# Patient Record
Sex: Male | Born: 1985 | Race: White | Hispanic: No | Marital: Married | Smoking: Former smoker
Health system: Southern US, Community
[De-identification: ages and names within clinical notes are randomized; demographics above are authoritative.]

## PROBLEM LIST (undated history)

## (undated) DIAGNOSIS — F909 Attention-deficit hyperactivity disorder, unspecified type: Secondary | ICD-10-CM

## (undated) DIAGNOSIS — K589 Irritable bowel syndrome without diarrhea: Secondary | ICD-10-CM

---
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
## 2010-06-24 ENCOUNTER — Emergency Department (HOSPITAL_COMMUNITY): Admission: EM | Admit: 2010-06-24 | Discharge: 2010-06-24 | Payer: Self-pay | Admitting: Emergency Medicine

## 2016-06-07 ENCOUNTER — Emergency Department (HOSPITAL_COMMUNITY)
Admission: EM | Admit: 2016-06-07 | Discharge: 2016-06-07 | Disposition: A | Discharge status: Home / Self Care | Payer: BLUE CROSS/BLUE SHIELD | Attending: Emergency Medicine | Admitting: Emergency Medicine

## 2016-06-07 ENCOUNTER — Emergency Department (HOSPITAL_COMMUNITY): Payer: BLUE CROSS/BLUE SHIELD

## 2016-06-07 ENCOUNTER — Encounter (HOSPITAL_COMMUNITY): Payer: Self-pay

## 2016-06-07 DIAGNOSIS — F909 Attention-deficit hyperactivity disorder, unspecified type: Secondary | ICD-10-CM | POA: Diagnosis not present

## 2016-06-07 DIAGNOSIS — Y939 Activity, unspecified: Secondary | ICD-10-CM | POA: Diagnosis not present

## 2016-06-07 DIAGNOSIS — Y92410 Unspecified street and highway as the place of occurrence of the external cause: Secondary | ICD-10-CM | POA: Diagnosis not present

## 2016-06-07 DIAGNOSIS — Z23 Encounter for immunization: Secondary | ICD-10-CM | POA: Insufficient documentation

## 2016-06-07 DIAGNOSIS — R55 Syncope and collapse: Secondary | ICD-10-CM | POA: Insufficient documentation

## 2016-06-07 DIAGNOSIS — Y999 Unspecified external cause status: Secondary | ICD-10-CM | POA: Insufficient documentation

## 2016-06-07 DIAGNOSIS — S060X1A Concussion with loss of consciousness of 30 minutes or less, initial encounter: Secondary | ICD-10-CM | POA: Insufficient documentation

## 2016-06-07 DIAGNOSIS — S0181XA Laceration without foreign body of other part of head, initial encounter: Secondary | ICD-10-CM | POA: Insufficient documentation

## 2016-06-07 DIAGNOSIS — T1490XA Injury, unspecified, initial encounter: Secondary | ICD-10-CM

## 2016-06-07 DIAGNOSIS — S0990XA Unspecified injury of head, initial encounter: Secondary | ICD-10-CM | POA: Diagnosis present

## 2016-06-07 HISTORY — DX: Irritable bowel syndrome without diarrhea: K58.9

## 2016-06-07 HISTORY — DX: Attention-deficit hyperactivity disorder, unspecified type: F90.9

## 2016-06-07 LAB — COMPREHENSIVE METABOLIC PANEL
ALT: 28 U/L (ref 17–63)
ANION GAP: 6 (ref 5–15)
AST: 24 U/L (ref 15–41)
Albumin: 4.2 g/dL (ref 3.5–5.0)
Alkaline Phosphatase: 55 U/L (ref 38–126)
BUN: 12 mg/dL (ref 6–20)
CALCIUM: 9.3 mg/dL (ref 8.9–10.3)
CHLORIDE: 108 mmol/L (ref 101–111)
CO2: 23 mmol/L (ref 22–32)
CREATININE: 1 mg/dL (ref 0.61–1.24)
Glucose, Bld: 106 mg/dL — ABNORMAL HIGH (ref 65–99)
POTASSIUM: 4.2 mmol/L (ref 3.5–5.1)
SODIUM: 137 mmol/L (ref 135–145)
TOTAL PROTEIN: 6.7 g/dL (ref 6.5–8.1)
Total Bilirubin: 0.6 mg/dL (ref 0.3–1.2)

## 2016-06-07 LAB — PROTIME-INR
INR: 0.91
Prothrombin Time: 12.3 seconds (ref 11.4–15.2)

## 2016-06-07 LAB — I-STAT CHEM 8, ED
BUN: 14 mg/dL (ref 6–20)
CREATININE: 1 mg/dL (ref 0.61–1.24)
Calcium, Ion: 1.17 mmol/L (ref 1.15–1.40)
Chloride: 103 mmol/L (ref 101–111)
GLUCOSE: 101 mg/dL — AB (ref 65–99)
HCT: 49 % (ref 39.0–52.0)
HEMOGLOBIN: 16.7 g/dL (ref 13.0–17.0)
POTASSIUM: 4.2 mmol/L (ref 3.5–5.1)
Sodium: 142 mmol/L (ref 135–145)
TCO2: 26 mmol/L (ref 0–100)

## 2016-06-07 LAB — CBC
HCT: 48 % (ref 39.0–52.0)
Hemoglobin: 16.9 g/dL (ref 13.0–17.0)
MCH: 31.5 pg (ref 26.0–34.0)
MCHC: 35.2 g/dL (ref 30.0–36.0)
MCV: 89.6 fL (ref 78.0–100.0)
Platelets: 148 K/uL — ABNORMAL LOW (ref 150–400)
RBC: 5.36 MIL/uL (ref 4.22–5.81)
RDW: 12.7 % (ref 11.5–15.5)
WBC: 5.5 K/uL (ref 4.0–10.5)

## 2016-06-07 LAB — URINALYSIS, ROUTINE W REFLEX MICROSCOPIC
BILIRUBIN URINE: NEGATIVE
Glucose, UA: NEGATIVE mg/dL
HGB URINE DIPSTICK: NEGATIVE
Ketones, ur: NEGATIVE mg/dL
Leukocytes, UA: NEGATIVE
Nitrite: NEGATIVE
PROTEIN: NEGATIVE mg/dL
SPECIFIC GRAVITY, URINE: 1.013 (ref 1.005–1.030)
pH: 7 (ref 5.0–8.0)

## 2016-06-07 LAB — ETHANOL

## 2016-06-07 LAB — I-STAT CG4 LACTIC ACID, ED: Lactic Acid, Venous: 1.8 mmol/L (ref 0.5–1.9)

## 2016-06-07 MED ORDER — FENTANYL CITRATE (PF) 100 MCG/2ML IJ SOLN
50.0000 ug | Freq: Once | INTRAMUSCULAR | Status: DC
  Filled 2016-06-07: qty 2

## 2016-06-07 MED ORDER — IBUPROFEN 200 MG PO TABS
600.0000 mg | ORAL_TABLET | Freq: Once | ORAL | Status: AC
  Administered 2016-06-07: 600 mg via ORAL
  Filled 2016-06-07: qty 1

## 2016-06-07 MED ORDER — TETANUS-DIPHTH-ACELL PERTUSSIS 5-2.5-18.5 LF-MCG/0.5 IM SUSP
0.5000 mL | Freq: Once | INTRAMUSCULAR | Status: AC
  Administered 2016-06-07: 0.5 mL via INTRAMUSCULAR
  Filled 2016-06-07: qty 0.5

## 2016-06-07 MED ORDER — LIDOCAINE-EPINEPHRINE (PF) 2 %-1:200000 IJ SOLN
20.0000 mL | Freq: Once | INTRAMUSCULAR | Status: AC
  Administered 2016-06-07: 20 mL
  Filled 2016-06-07: qty 20

## 2016-06-07 NOTE — Discharge Instructions (Signed)
Have sutures removed in 7 days. Watch for signs of infection, including fever, increased redness/swelling or pus drainage an return without fail for worsening symptoms, including signs of infection, escalating pain, difficulty walking, confusion, intractable vomiting or any other symptoms concerning to you.  You did not have serious injury from your CT head and neck. X-rays of the left arm, chest and pelvis also were normal.  Take tylenol and ibuprofen for pain. Use ice packs or heat packs.
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
## 2016-06-07 NOTE — ED Notes (Signed)
Cleaned laceration under chin.
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
## 2016-06-07 NOTE — ED Notes (Signed)
Patient at CT
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
## 2019-07-17 ENCOUNTER — Other Ambulatory Visit: Payer: Self-pay

## 2019-07-17 DIAGNOSIS — Z20822 Contact with and (suspected) exposure to covid-19: Secondary | ICD-10-CM

## 2019-07-19 LAB — NOVEL CORONAVIRUS, NAA: SARS-CoV-2, NAA: NOT DETECTED

## 2020-04-27 ENCOUNTER — Other Ambulatory Visit: Payer: Self-pay | Admitting: Family Medicine

## 2020-04-27 DIAGNOSIS — R0981 Nasal congestion: Secondary | ICD-10-CM

## 2020-05-04 ENCOUNTER — Other Ambulatory Visit: Payer: Self-pay | Admitting: Family Medicine

## 2020-05-06 ENCOUNTER — Ambulatory Visit
Admission: RE | Admit: 2020-05-06 | Discharge: 2020-05-06 | Disposition: A | Discharge status: Home / Self Care | Payer: PRIVATE HEALTH INSURANCE | Source: Ambulatory Visit | Attending: Family Medicine | Admitting: Family Medicine

## 2020-05-06 ENCOUNTER — Other Ambulatory Visit: Payer: Self-pay

## 2020-05-06 DIAGNOSIS — R0981 Nasal congestion: Secondary | ICD-10-CM

## 2020-05-24 ENCOUNTER — Ambulatory Visit (INDEPENDENT_AMBULATORY_CARE_PROVIDER_SITE_OTHER): Payer: PRIVATE HEALTH INSURANCE | Admitting: Otolaryngology

## 2020-05-24 ENCOUNTER — Other Ambulatory Visit: Payer: Self-pay

## 2020-05-24 ENCOUNTER — Encounter (INDEPENDENT_AMBULATORY_CARE_PROVIDER_SITE_OTHER): Payer: Self-pay | Admitting: Otolaryngology

## 2020-05-24 VITALS — Temp 97.9°F

## 2020-05-24 DIAGNOSIS — J342 Deviated nasal septum: Secondary | ICD-10-CM

## 2020-05-24 DIAGNOSIS — J310 Chronic rhinitis: Secondary | ICD-10-CM

## 2020-05-24 NOTE — Progress Notes (Signed)
HPI: Damon Molina is a 34 y.o. male who presents is referred by Dr. Laurann Montana for evaluation of chronic nasal sinus congestion.  Patient has a long history of chronic nasal congestion.  He has used Flonase regularly for a number of years.  This seems to help some but does not totally clear his nasal passages.  He does snore regularly but feels like he sleeps well.  Denies symptoms of obstructive sleep apnea or anybody witnessing that he has trouble breathing at night outside of snoring.  He seems to have more trouble breathing at night than during the day.  He recently had a CT scan performed 2 weeks ago and on review of this he had minimal sinus disease.  Mild septal deviation to the right and clear nasal passages otherwise with no intranasal polyps. He has used Diplomatic Services operational officer for a number of years as well as used azelastine in the past.  Past Medical History:  Diagnosis Date  . ADHD (attention deficit hyperactivity disorder)   . IBS (irritable bowel syndrome)    No past surgical history on file. Social History   Socioeconomic History  . Marital status: Married    Spouse name: Not on file  . Number of children: Not on file  . Years of education: Not on file  . Highest education level: Not on file  Occupational History  . Not on file  Tobacco Use  . Smoking status: Former Smoker    Packs/day: 0.50    Years: 14.00    Pack years: 7.00  . Smokeless tobacco: Never Used  Substance and Sexual Activity  . Alcohol use: Not on file  . Drug use: Not on file  . Sexual activity: Not on file  Other Topics Concern  . Not on file  Social History Narrative  . Not on file   Social Determinants of Health   Financial Resource Strain:   . Difficulty of Paying Living Expenses: Not on file  Food Insecurity:   . Worried About Programme researcher, broadcasting/film/video in the Last Year: Not on file  . Ran Out of Food in the Last Year: Not on file  Transportation Needs:   . Lack of Transportation (Medical): Not  on file  . Lack of Transportation (Non-Medical): Not on file  Physical Activity:   . Days of Exercise per Week: Not on file  . Minutes of Exercise per Session: Not on file  Stress:   . Feeling of Stress : Not on file  Social Connections:   . Frequency of Communication with Friends and Family: Not on file  . Frequency of Social Gatherings with Friends and Family: Not on file  . Attends Religious Services: Not on file  . Active Member of Clubs or Organizations: Not on file  . Attends Banker Meetings: Not on file  . Marital Status: Not on file   No family history on file. Allergies  Allergen Reactions  . Codeine   . Penicillins    Prior to Admission medications   Medication Sig Start Date End Date Taking? Authorizing Provider  amphetamine-dextroamphetamine (ADDERALL XR) 25 MG 24 hr capsule Take 25 mg by mouth every morning.   Yes [provider]  dicyclomine (BENTYL) 10 MG capsule Take 10 mg by mouth daily.   Yes [provider]  PARoxetine (PAXIL) 10 MG tablet Take 10 mg by mouth daily.   Yes [provider]     Positive ROS: Otherwise negative  All other systems  have been reviewed and were otherwise negative with the exception of those mentioned in the HPI and as above.  Physical Exam: Constitutional: Alert, well-appearing, no acute distress.  Patient is moderately overweight. Ears: External ears without lesions or tenderness. Ear canals are clear bilaterally with intact, clear TMs.  Nasal: External nose without lesions. Septum is mildly deviated to the right anteriorly.  After decongesting the nose nasal passages otherwise clear with no polyps or intranasal lesions noted.  Both middle meatus regions are clear.  He has moderate size inferior turbinates..  After decongesting the nose with Afrin he breathe much more comfortably. Oral: Lips and gums without lesions. Tongue and palate mucosa without lesions. Posterior oropharynx clear.  Patient  with small average sized tonsils. Neck: No palpable adenopathy or masses Respiratory: Breathing comfortably  Skin: No facial/neck lesions or rash noted.  Procedures  Assessment: Mild septal deformity with moderate turbinate partially. No significant sinus disease on review of recent CT scan.  Plan: I discussed with him that I would recommend regular use of nasal steroid spray as he has been doing. Reviewed the CT scan with him in the office today and there is no significant evidence of sinus disease contributing to his nasal obstruction. He would be a candidate for septoplasty and turbinate reductions to help improve his nasal airway if he was interested in surgical approach.  Also reviewed with him that this may help improve the snoring although he could try use of Afrin for couple nights to see how much improvement of his nasal airway will improve his snoring but would not use Afrin regularly as this can be addictive and cause more problems and he is aware this. He will call us back if he decides to pursue surgical intervention to help improve his nasal airway.   Narda Bonds, MD   CC:

## 2021-06-23 IMAGING — CT CT MAXILLOFACIAL W/O CM
1 series · 10 of 30 positions shown, 13 images · non-contrast
Comparison: None.

CLINICAL DATA: Nasal congestion, snoring.

EXAM:
CT MAXILLOFACIAL WITHOUT CONTRAST
TECHNIQUE: Multidetector CT imaging of the maxillofacial structures was
performed. Multiplanar CT image reconstructions were also generated.

[Series 8: sag soft · sagittal · 0.26mm/px · 10 of 111 slices shown, 13 images]
[im 8/111  brain]
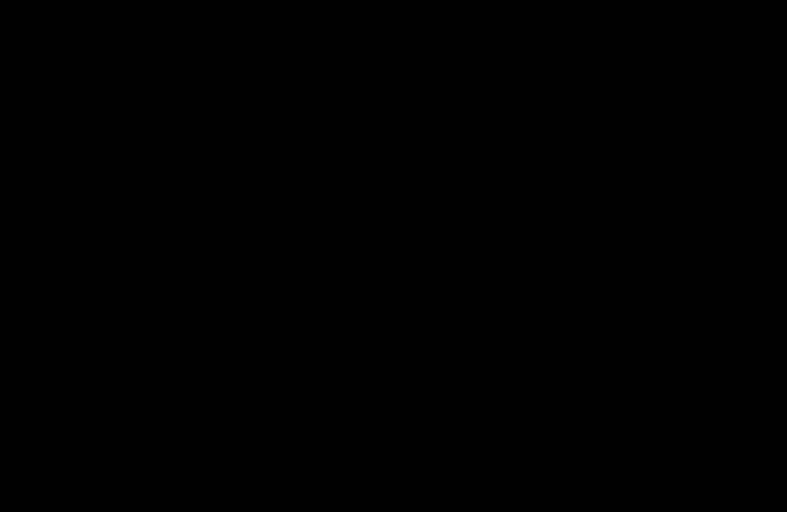
[im 8/111  bone]
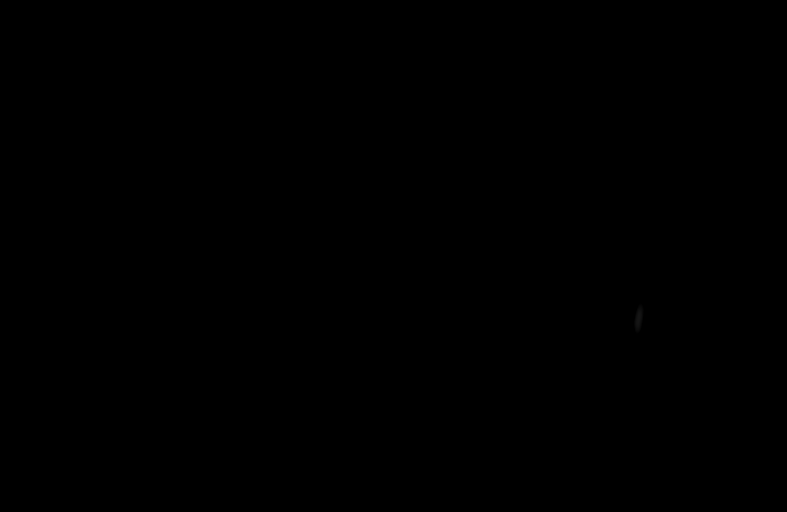
[im 19/111  bone]
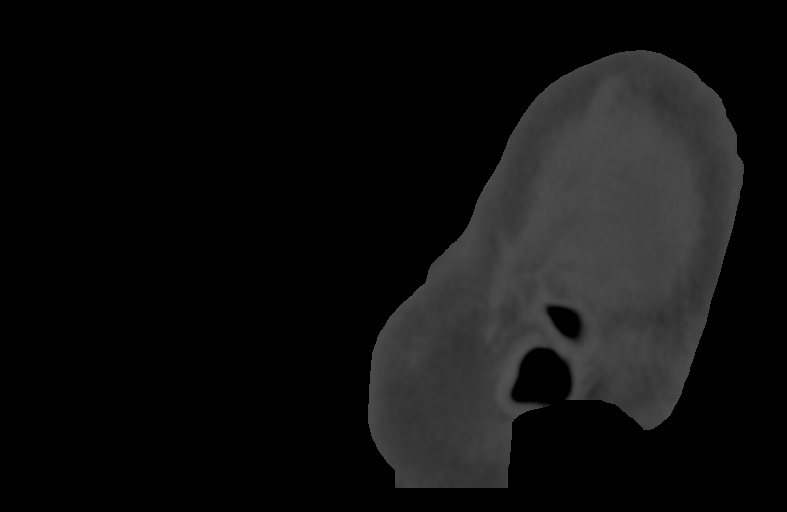
[im 31/111  bone]
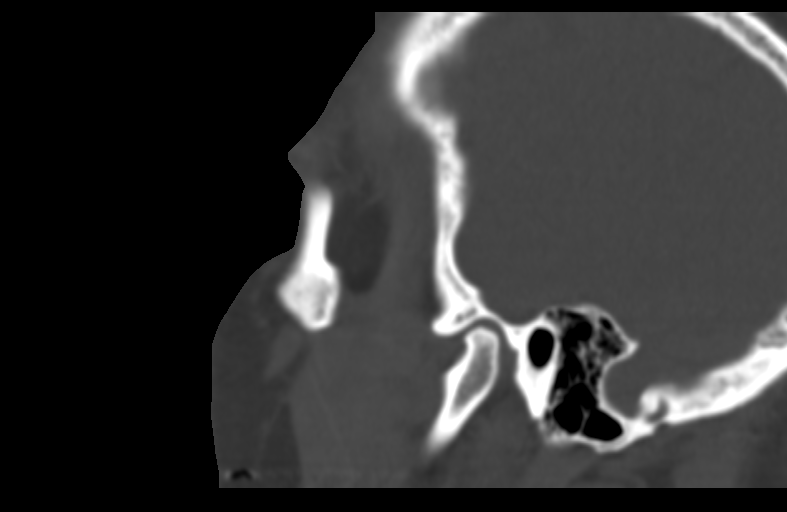
[im 38/111  bone]
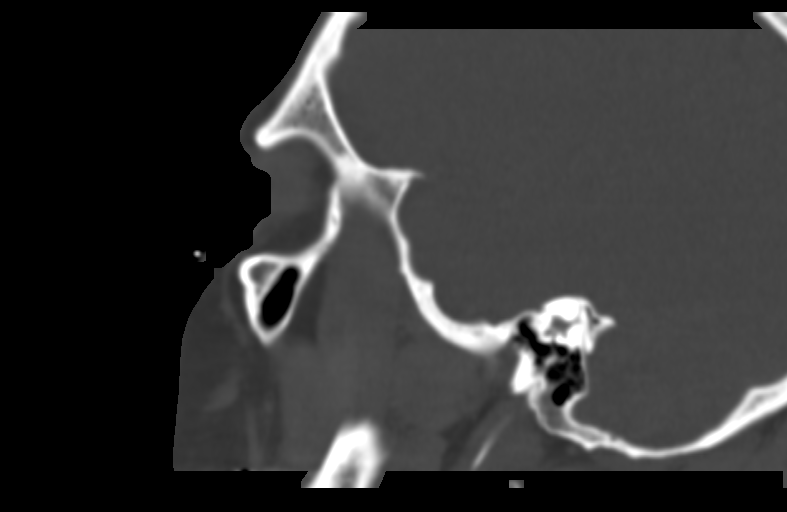
[im 50/111  brain]
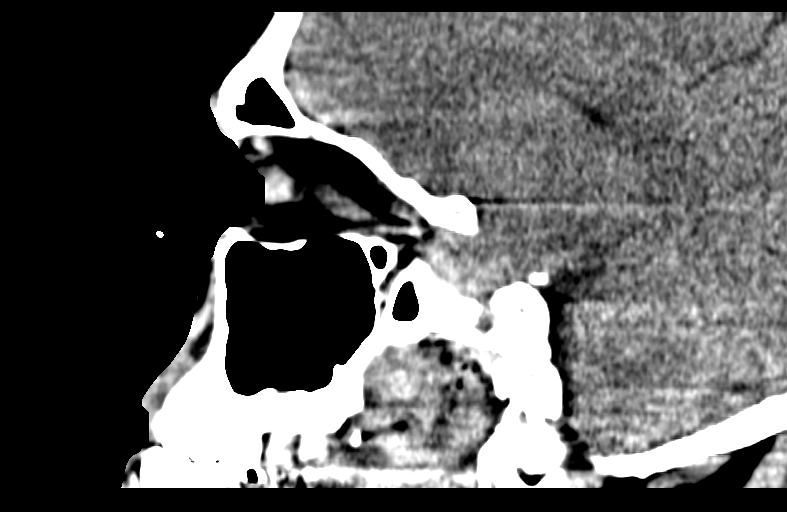
[im 50/111  bone]
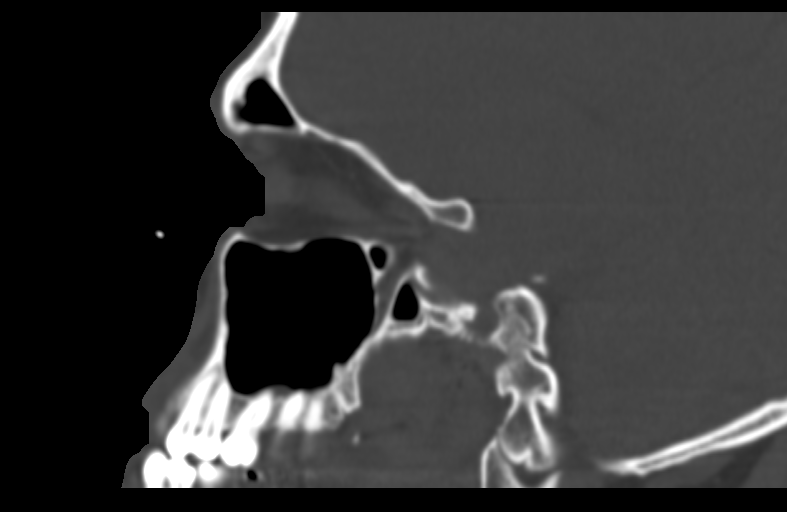
[im 61/111  bone]
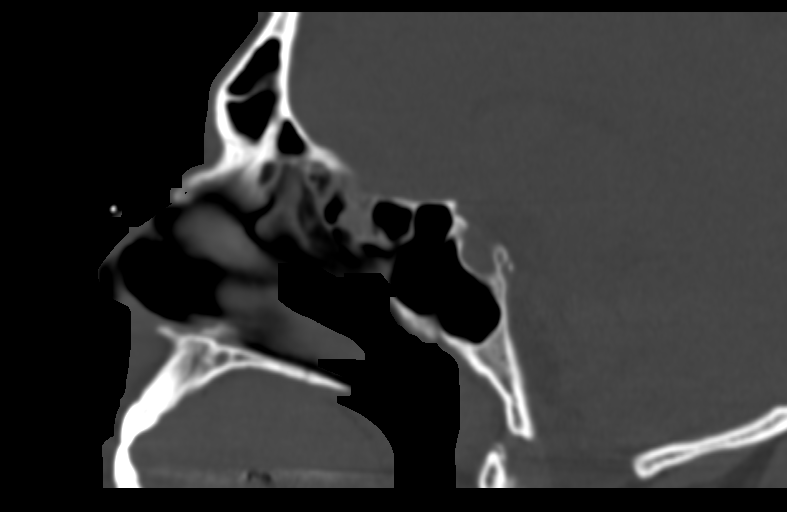
[im 73/111  bone]
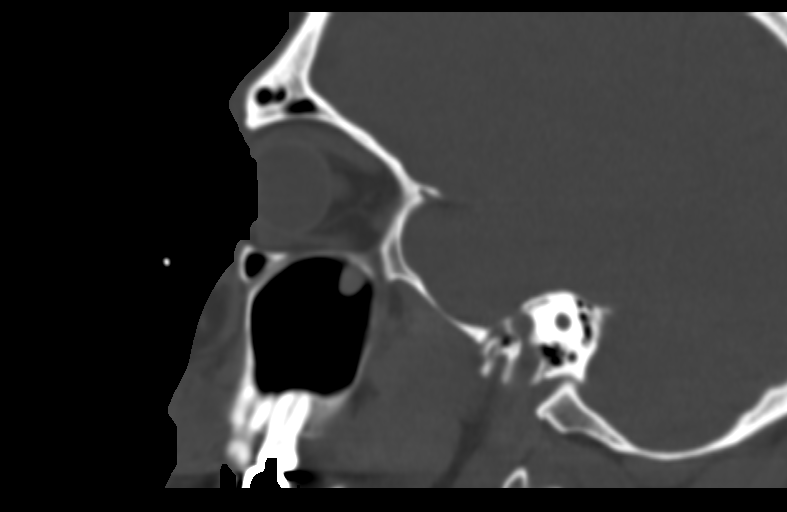
[im 84/111  bone]
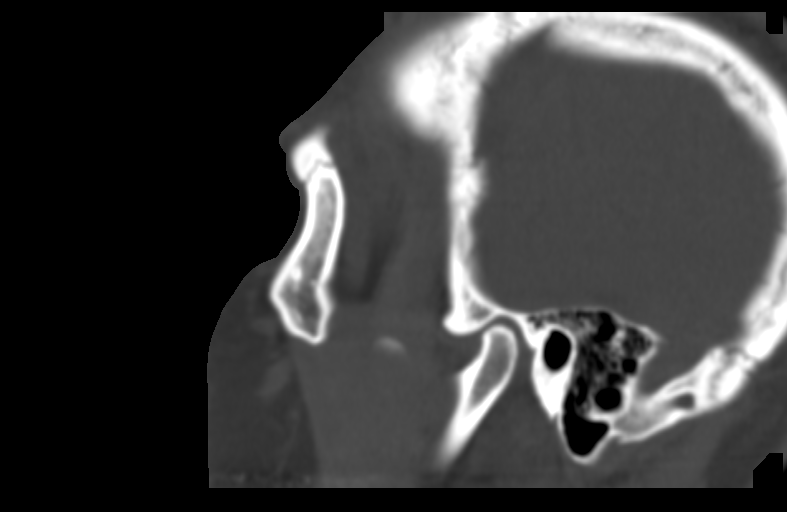
[im 92/111  brain]
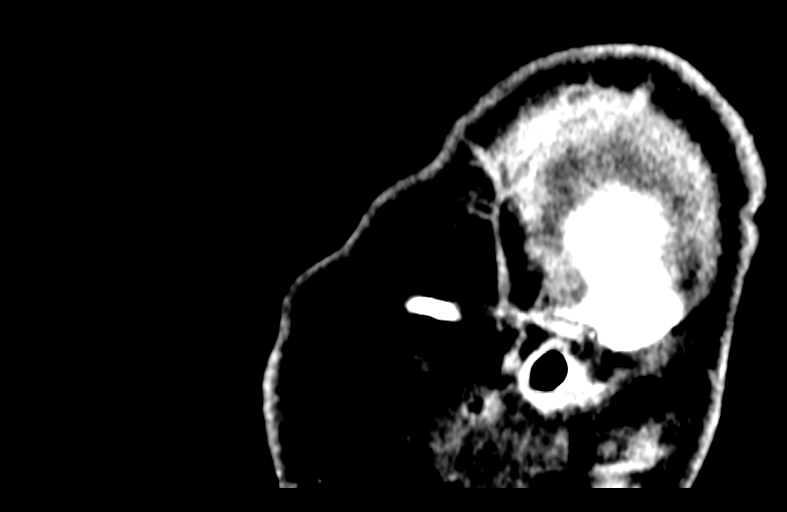
[im 92/111  bone]
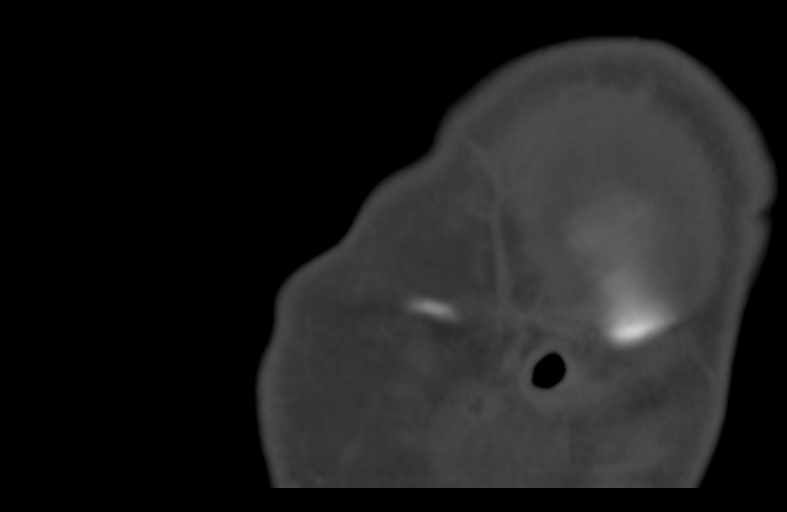
[im 103/111  bone]
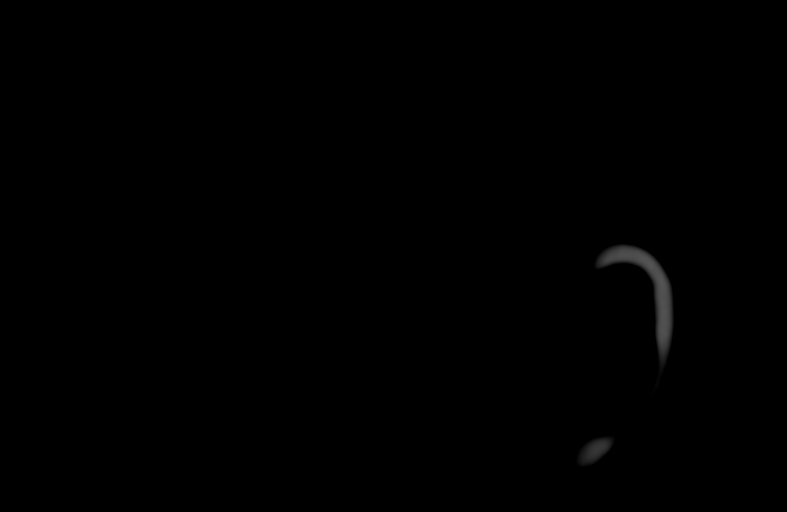

[10 of 30 positions shown; findings below may reference images not displayed]

FINDINGS: Osseous: No fracture or mandibular dislocation. No destructive
process.

Orbits: Negative. No traumatic or inflammatory finding.

Sinuses: Paranasal sinuses are well developed and well aerated. Mild
opacification over the ethmoid air cells. Minimal focal mucosal
thickening over the maxillary sinus likely small polyps or mucous
retention cysts. No air-fluid levels. No significant mucoperiosteal
thickening. Ostiomeatal complexes are patent.

Soft tissues: Negative.

Limited intracranial: No significant or unexpected finding.
IMPRESSION: Minimal chronic sinus inflammatory change as described. No air-fluid
levels to suggest acute sinusitis.

## 2022-12-18 ENCOUNTER — Ambulatory Visit: Payer: Managed Care, Other (non HMO) | Admitting: Family Medicine

## 2022-12-18 ENCOUNTER — Encounter: Payer: Self-pay | Admitting: Family Medicine

## 2022-12-18 VITALS — BP 124/82 | Ht 71.0 in | Wt 225.0 lb

## 2022-12-18 DIAGNOSIS — Q6689 Other  specified congenital deformities of feet: Secondary | ICD-10-CM | POA: Diagnosis not present

## 2022-12-18 DIAGNOSIS — M79661 Pain in right lower leg: Secondary | ICD-10-CM

## 2022-12-18 NOTE — Progress Notes (Signed)
PCP: Patient, No Pcp Per  Subjective:   HPI: Patient is a 37 y.o. male here for custom orthotics.  Patient comes in today as a referral from Dr. Sheppard Coil for custom orthotics. Has had these before with good success. History of right clubfoot. He enjoys running about 30 minutes 5x/week. No new injuries or trauma. Presented to Dr. Sheppard Coil with pain of right shin, thought orthotics may help with this.  Past Medical History:  Diagnosis Date   ADHD (attention deficit hyperactivity disorder)    IBS (irritable bowel syndrome)     Current Outpatient Medications on File Prior to Visit  Medication Sig Dispense Refill   amphetamine-dextroamphetamine (ADDERALL XR) 25 MG 24 hr capsule Take 25 mg by mouth every morning.     dicyclomine (BENTYL) 10 MG capsule Take 10 mg by mouth daily.     PARoxetine (PAXIL) 10 MG tablet Take 10 mg by mouth daily.     No current facility-administered medications on file prior to visit.    History reviewed. No pertinent surgical history.  Allergies  Allergen Reactions   Codeine    Penicillins     BP 124/82   Ht 5\' 11"  (1.803 m)   Wt 225 lb (102.1 kg)   BMI 31.38 kg/m       No data to display              No data to display              Objective:  Physical Exam:  Gen: NAD, comfortable in exam room  Bilateral feet/ankles: Significant loss of right longitudinal arch with mild medial subluxation of ankle.  Hallux rigidus bilaterally. Preserved left long arch. No TTP currently Negative ant drawer and negative talar tilt.   Thompsons test negative. NV intact distally. Leg lengths equal.   Assessment & Plan:  Right shin pain - likely due to shin splints.  Custom orthotics made today as below and felt comfortable.  Discussed how to break these in, what to prompt call to our office.  Consider additional arch support with scaphoid pad on right, first ray posts depending on his symptoms going forward.  Patient was fitted for a :  standard, cushioned, semi-rigid orthotic. The orthotic was heated and afterward the patient stood on the orthotic blank positioned on the orthotic stand. The patient was positioned in subtalar neutral position and 10 degrees of ankle dorsiflexion in a weight bearing stance. After completion of molding, a stable base was applied to the orthotic blank. The blank was ground to a stable position for weight bearing. Size: 12 Base: blue med density eva Posting: none Additional orthotic padding: none

## 2023-08-09 DIAGNOSIS — K219 Gastro-esophageal reflux disease without esophagitis: Secondary | ICD-10-CM | POA: Diagnosis not present

## 2023-08-09 DIAGNOSIS — Z23 Encounter for immunization: Secondary | ICD-10-CM | POA: Diagnosis not present

## 2023-08-09 DIAGNOSIS — E785 Hyperlipidemia, unspecified: Secondary | ICD-10-CM | POA: Diagnosis not present

## 2023-08-09 DIAGNOSIS — Z0000 Encounter for general adult medical examination without abnormal findings: Secondary | ICD-10-CM | POA: Diagnosis not present

## 2023-08-09 DIAGNOSIS — Z79899 Other long term (current) drug therapy: Secondary | ICD-10-CM | POA: Diagnosis not present

## 2023-08-09 DIAGNOSIS — F419 Anxiety disorder, unspecified: Secondary | ICD-10-CM | POA: Diagnosis not present

## 2023-08-09 DIAGNOSIS — F902 Attention-deficit hyperactivity disorder, combined type: Secondary | ICD-10-CM | POA: Diagnosis not present

## 2023-08-09 DIAGNOSIS — K589 Irritable bowel syndrome without diarrhea: Secondary | ICD-10-CM | POA: Diagnosis not present

## 2023-09-28 DIAGNOSIS — Z302 Encounter for sterilization: Secondary | ICD-10-CM | POA: Diagnosis not present

## 2023-10-16 DIAGNOSIS — J209 Acute bronchitis, unspecified: Secondary | ICD-10-CM | POA: Diagnosis not present

## 2023-10-16 DIAGNOSIS — R051 Acute cough: Secondary | ICD-10-CM | POA: Diagnosis not present

## 2023-10-16 DIAGNOSIS — R0981 Nasal congestion: Secondary | ICD-10-CM | POA: Diagnosis not present

## 2023-10-16 DIAGNOSIS — J019 Acute sinusitis, unspecified: Secondary | ICD-10-CM | POA: Diagnosis not present

## 2023-12-06 DIAGNOSIS — F419 Anxiety disorder, unspecified: Secondary | ICD-10-CM | POA: Diagnosis not present

## 2023-12-06 DIAGNOSIS — R000 Tachycardia, unspecified: Secondary | ICD-10-CM | POA: Diagnosis not present

## 2023-12-06 DIAGNOSIS — F902 Attention-deficit hyperactivity disorder, combined type: Secondary | ICD-10-CM | POA: Diagnosis not present

## 2024-02-08 DIAGNOSIS — K589 Irritable bowel syndrome without diarrhea: Secondary | ICD-10-CM | POA: Diagnosis not present

## 2024-02-08 DIAGNOSIS — K219 Gastro-esophageal reflux disease without esophagitis: Secondary | ICD-10-CM | POA: Diagnosis not present

## 2024-02-08 DIAGNOSIS — F902 Attention-deficit hyperactivity disorder, combined type: Secondary | ICD-10-CM | POA: Diagnosis not present

## 2024-02-08 DIAGNOSIS — F419 Anxiety disorder, unspecified: Secondary | ICD-10-CM | POA: Diagnosis not present

## 2024-03-04 DIAGNOSIS — F909 Attention-deficit hyperactivity disorder, unspecified type: Secondary | ICD-10-CM | POA: Diagnosis not present

## 2024-03-04 DIAGNOSIS — F411 Generalized anxiety disorder: Secondary | ICD-10-CM | POA: Diagnosis not present

## 2024-03-04 DIAGNOSIS — K589 Irritable bowel syndrome without diarrhea: Secondary | ICD-10-CM | POA: Diagnosis not present

## 2024-03-11 DIAGNOSIS — F411 Generalized anxiety disorder: Secondary | ICD-10-CM | POA: Diagnosis not present

## 2024-03-11 DIAGNOSIS — F909 Attention-deficit hyperactivity disorder, unspecified type: Secondary | ICD-10-CM | POA: Diagnosis not present

## 2024-03-11 DIAGNOSIS — K589 Irritable bowel syndrome without diarrhea: Secondary | ICD-10-CM | POA: Diagnosis not present

## 2024-03-20 DIAGNOSIS — F909 Attention-deficit hyperactivity disorder, unspecified type: Secondary | ICD-10-CM | POA: Diagnosis not present

## 2024-03-20 DIAGNOSIS — K589 Irritable bowel syndrome without diarrhea: Secondary | ICD-10-CM | POA: Diagnosis not present

## 2024-03-20 DIAGNOSIS — F411 Generalized anxiety disorder: Secondary | ICD-10-CM | POA: Diagnosis not present

## 2024-03-25 DIAGNOSIS — K589 Irritable bowel syndrome without diarrhea: Secondary | ICD-10-CM | POA: Diagnosis not present

## 2024-03-25 DIAGNOSIS — F411 Generalized anxiety disorder: Secondary | ICD-10-CM | POA: Diagnosis not present

## 2024-03-25 DIAGNOSIS — F909 Attention-deficit hyperactivity disorder, unspecified type: Secondary | ICD-10-CM | POA: Diagnosis not present

## 2024-04-03 DIAGNOSIS — F909 Attention-deficit hyperactivity disorder, unspecified type: Secondary | ICD-10-CM | POA: Diagnosis not present

## 2024-04-03 DIAGNOSIS — F411 Generalized anxiety disorder: Secondary | ICD-10-CM | POA: Diagnosis not present

## 2024-04-03 DIAGNOSIS — K589 Irritable bowel syndrome without diarrhea: Secondary | ICD-10-CM | POA: Diagnosis not present

## 2024-04-08 DIAGNOSIS — K589 Irritable bowel syndrome without diarrhea: Secondary | ICD-10-CM | POA: Diagnosis not present

## 2024-04-08 DIAGNOSIS — F411 Generalized anxiety disorder: Secondary | ICD-10-CM | POA: Diagnosis not present

## 2024-04-08 DIAGNOSIS — F909 Attention-deficit hyperactivity disorder, unspecified type: Secondary | ICD-10-CM | POA: Diagnosis not present

## 2024-04-15 DIAGNOSIS — F909 Attention-deficit hyperactivity disorder, unspecified type: Secondary | ICD-10-CM | POA: Diagnosis not present

## 2024-04-15 DIAGNOSIS — K589 Irritable bowel syndrome without diarrhea: Secondary | ICD-10-CM | POA: Diagnosis not present

## 2024-04-15 DIAGNOSIS — F411 Generalized anxiety disorder: Secondary | ICD-10-CM | POA: Diagnosis not present

## 2024-04-29 DIAGNOSIS — K589 Irritable bowel syndrome without diarrhea: Secondary | ICD-10-CM | POA: Diagnosis not present

## 2024-04-29 DIAGNOSIS — F411 Generalized anxiety disorder: Secondary | ICD-10-CM | POA: Diagnosis not present

## 2024-04-29 DIAGNOSIS — F909 Attention-deficit hyperactivity disorder, unspecified type: Secondary | ICD-10-CM | POA: Diagnosis not present

## 2024-05-06 DIAGNOSIS — K589 Irritable bowel syndrome without diarrhea: Secondary | ICD-10-CM | POA: Diagnosis not present

## 2024-05-06 DIAGNOSIS — F411 Generalized anxiety disorder: Secondary | ICD-10-CM | POA: Diagnosis not present

## 2024-05-06 DIAGNOSIS — F909 Attention-deficit hyperactivity disorder, unspecified type: Secondary | ICD-10-CM | POA: Diagnosis not present

## 2024-05-19 DIAGNOSIS — K589 Irritable bowel syndrome without diarrhea: Secondary | ICD-10-CM | POA: Diagnosis not present

## 2024-05-19 DIAGNOSIS — F909 Attention-deficit hyperactivity disorder, unspecified type: Secondary | ICD-10-CM | POA: Diagnosis not present

## 2024-05-19 DIAGNOSIS — F411 Generalized anxiety disorder: Secondary | ICD-10-CM | POA: Diagnosis not present

## 2024-05-27 DIAGNOSIS — K589 Irritable bowel syndrome without diarrhea: Secondary | ICD-10-CM | POA: Diagnosis not present

## 2024-05-27 DIAGNOSIS — F411 Generalized anxiety disorder: Secondary | ICD-10-CM | POA: Diagnosis not present

## 2024-05-27 DIAGNOSIS — F909 Attention-deficit hyperactivity disorder, unspecified type: Secondary | ICD-10-CM | POA: Diagnosis not present

## 2024-08-18 DIAGNOSIS — M791 Myalgia, unspecified site: Secondary | ICD-10-CM | POA: Diagnosis not present

## 2024-08-18 DIAGNOSIS — R059 Cough, unspecified: Secondary | ICD-10-CM | POA: Diagnosis not present

## 2024-08-18 DIAGNOSIS — R6883 Chills (without fever): Secondary | ICD-10-CM | POA: Diagnosis not present

## 2024-08-18 DIAGNOSIS — R0981 Nasal congestion: Secondary | ICD-10-CM | POA: Diagnosis not present

## 2024-08-25 DIAGNOSIS — F902 Attention-deficit hyperactivity disorder, combined type: Secondary | ICD-10-CM | POA: Diagnosis not present

## 2024-08-25 DIAGNOSIS — Z0000 Encounter for general adult medical examination without abnormal findings: Secondary | ICD-10-CM | POA: Diagnosis not present

## 2024-08-25 DIAGNOSIS — K589 Irritable bowel syndrome without diarrhea: Secondary | ICD-10-CM | POA: Diagnosis not present

## 2024-08-25 DIAGNOSIS — E559 Vitamin D deficiency, unspecified: Secondary | ICD-10-CM | POA: Diagnosis not present

## 2024-08-25 DIAGNOSIS — K219 Gastro-esophageal reflux disease without esophagitis: Secondary | ICD-10-CM | POA: Diagnosis not present

## 2024-08-25 DIAGNOSIS — F419 Anxiety disorder, unspecified: Secondary | ICD-10-CM | POA: Diagnosis not present

## 2024-08-25 DIAGNOSIS — Z1159 Encounter for screening for other viral diseases: Secondary | ICD-10-CM | POA: Diagnosis not present

## 2024-08-26 DIAGNOSIS — Z23 Encounter for immunization: Secondary | ICD-10-CM | POA: Diagnosis not present
